# Patient Record
Sex: Male | Born: 1985 | Race: Black or African American | Hispanic: No | Marital: Single | Smoking: Current some day smoker
Health system: Southern US, Community
[De-identification: ages and names within clinical notes are randomized; demographics above are authoritative.]

---
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
## 2011-09-15 ENCOUNTER — Ambulatory Visit: Payer: Self-pay

## 2012-09-24 ENCOUNTER — Ambulatory Visit: Payer: Self-pay | Admitting: Internal Medicine

## 2012-09-24 VITALS — BP 158/76 | HR 71 | Temp 98.0°F | Resp 16 | Ht 69.5 in | Wt 148.0 lb

## 2012-09-24 DIAGNOSIS — M25519 Pain in unspecified shoulder: Secondary | ICD-10-CM

## 2012-09-24 DIAGNOSIS — M549 Dorsalgia, unspecified: Secondary | ICD-10-CM

## 2012-09-24 MED ORDER — MELOXICAM 15 MG PO TABS: 15.0000 mg | ORAL_TABLET | Freq: Every day | ORAL | Status: DC

## 2012-09-25 NOTE — Progress Notes (Signed)
  Subjective:    Patient ID: Derek Golden, male    DOB: 1986-07-08, 27 y.o.   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 Having trouble with pain in his right shoulder in his lumbar region ever since Has to work with heavy objects and has a time-limit for completing tasks There is no associated numbness or weakness Pain does not radiate into the extremities Generally he does not hurt as much at work but  later Does not interfere with sleep  Review of Systems     Objective:   Physical Exam Blood pressure 158/76 Right shoulder has a full range of motion without pain There is no pain with resisted movements He is somewhat tender over the anterior deltoid but not the biceps tendon The lumbar area is nontender to palpation/there is no muscle spasm Straight leg raise intact to 90 bilaterally Deep tendon reflexes intact Forward flexion intact       Assessment & Plan:  Problem #1 back pain Problem #2 shoulder pain  Both of these should represent overuse injuries Mobic 15 daily Exercises for shoulder and low back Modify lifting for the next 2 weeks Followup in 2-4 weeks if not well

## 2012-09-26 ENCOUNTER — Telehealth: Payer: Self-pay

## 2012-09-26 NOTE — Telephone Encounter (Signed)
Patient was advised at [name], he could not work modified duty and he would need to take 2 weeks off. He advised he can not do this. He requested note to return on 09/28/12 full duty. I have provided him the note, and advised him if he has problems he is to let me know. FYI Amy

## 2012-09-26 NOTE — Telephone Encounter (Signed)
PT STATES HE WAS GIVEN AN OOW NOTE FOR 2 WEEKS BUT HE CAN'T BE OUT THAT LONG. WOULD LIKE A NOTE TO GO BACK TO WORK 09/28/12 PLEASE CALL [phone] WHEN READY

## 2012-11-04 ENCOUNTER — Ambulatory Visit: Payer: Self-pay | Admitting: Internal Medicine

## 2012-11-04 VITALS — BP 143/72 | HR 77 | Temp 97.8°F | Resp 16 | Ht 69.18 in | Wt 142.4 lb

## 2012-11-04 DIAGNOSIS — R5383 Other fatigue: Secondary | ICD-10-CM

## 2012-11-04 NOTE — Progress Notes (Signed)
  Subjective:    Patient ID: Derek Golden, male    DOB: 1985-10-09, 27 y.o.   MRN: [id number]  HPI Has recovered from a bug.   Review of Systems     Objective:   Physical Exam Normal       Assessment & Plan:  Note for work

## 2012-11-27 ENCOUNTER — Ambulatory Visit (INDEPENDENT_AMBULATORY_CARE_PROVIDER_SITE_OTHER): Payer: Managed Care, Other (non HMO) | Admitting: Emergency Medicine

## 2012-11-27 VITALS — BP 130/80 | HR 86 | Temp 98.5°F | Resp 16 | Ht 69.0 in | Wt 139.0 lb

## 2012-11-27 DIAGNOSIS — G5682 Other specified mononeuropathies of left upper limb: Secondary | ICD-10-CM

## 2012-11-27 DIAGNOSIS — G5680 Other specified mononeuropathies of unspecified upper limb: Secondary | ICD-10-CM

## 2012-11-27 MED ORDER — NAPROXEN SODIUM 550 MG PO TABS: 550.0000 mg | ORAL_TABLET | Freq: Two times a day (BID) | ORAL | Status: DC

## 2012-11-27 NOTE — Patient Instructions (Addendum)
Back Pain, Adult Low back pain is very common. About 1 in 5 people have back pain.The cause of low back pain is rarely dangerous. The pain often gets better over time.About half of people with a sudden onset of back pain feel better in just 2 weeks. About 8 in 10 people feel better by 6 weeks.  CAUSES Some common causes of back pain include:  Strain of the muscles or ligaments supporting the spine.  Wear and tear (degeneration) of the spinal discs.  Arthritis.  Direct injury to the back. DIAGNOSIS Most of the time, the direct cause of low back pain is not known.However, back pain can be treated effectively even when the exact cause of the pain is unknown.Answering your caregiver's questions about your overall health and symptoms is one of the most accurate ways to make sure the cause of your pain is not dangerous. If your caregiver needs more information, he or she may order lab work or imaging tests (X-rays or MRIs).However, even if imaging tests show changes in your back, this usually does not require surgery. HOME CARE INSTRUCTIONS For many people, back pain returns.Since low back pain is rarely dangerous, it is often a condition that people can learn to manageon their own.   Remain active. It is stressful on the back to sit or stand in one place. Do not sit, drive, or stand in one place for more than 30 minutes at a time. Take short walks on level surfaces as soon as pain allows.Try to increase the length of time you walk each day.  Do not stay in bed.Resting more than 1 or 2 days can delay your recovery.  Do not avoid exercise or work.Your body is made to move.It is not dangerous to be active, even though your back may hurt.Your back will likely heal faster if you return to being active before your pain is gone.  Pay attention to your body when you bend and lift. Many people have less discomfortwhen lifting if they bend their knees, keep the load close to their bodies,and  avoid twisting. Often, the most comfortable positions are those that put less stress on your recovering back.  Find a comfortable position to sleep. Use a firm mattress and lie on your side with your knees slightly bent. If you lie on your back, put a pillow under your knees.  Only take over-the-counter or prescription medicines as directed by your caregiver. Over-the-counter medicines to reduce pain and inflammation are often the most helpful.Your caregiver may prescribe muscle relaxant drugs.These medicines help dull your pain so you can more quickly return to your normal activities and healthy exercise.  Put ice on the injured area.  Put ice in a plastic bag.  Place a towel between your skin and the bag.  Leave the ice on for 15 to 20 minutes, 3 to 4 times a day for the first 2 to 3 days. After that, ice and heat may be alternated to reduce pain and spasms.  Ask your caregiver about trying back exercises and gentle massage. This may be of some benefit.  Avoid feeling anxious or stressed.Stress increases muscle tension and can worsen back pain.It is important to recognize when you are anxious or stressed and learn ways to manage it.Exercise is a great option. SEEK MEDICAL CARE IF:  You have pain that is not relieved with rest or medicine.  You have pain that does not improve in 1 week.  You have new symptoms.  You are generally   not feeling well. SEEK IMMEDIATE MEDICAL CARE IF:   You have pain that radiates from your back into your legs.  You develop new bowel or bladder control problems.  You have unusual weakness or numbness in your arms or legs.  You develop nausea or vomiting.  You develop abdominal pain.  You feel faint. Document Released: 08/23/2005 Document Revised: 02/22/2012 Document Reviewed: 01/11/2011 [employer] Patient Information 2013 [employer].  

## 2012-11-27 NOTE — Progress Notes (Signed)
[facility] [address] [place] [address] [phone]  Date:  11/27/2012   Name:  Derek Golden   DOB:  1986-08-02   MRN:  [id number]  PCP:  No primary provider on file.    Chief Complaint: Back Pain   History of Present Illness:  Derek Golden is a 27 y.o. very pleasant male patient who presents with the following:  Injured his back helping a friend move and was unable to go to work last night on night shift.  Pain not radiating and now largely resolved.  Needs a work note to return to work.  Denies other complaint or health concern today. Works in a [employer].  There is no problem list on file for this patient.   History reviewed. No pertinent past medical history.  History reviewed. No pertinent past surgical history.  History  Substance Use Topics  . Smoking status: Current Some Day Smoker  . Smokeless tobacco: Not on file  . Alcohol Use: 0.6 oz/week    1 Shots of liquor per week     Comment: 1 bottle weekly     History reviewed. No pertinent family history.  No Known Allergies  Medication list has been reviewed and updated.  Current Outpatient Prescriptions on File Prior to Visit  Medication Sig Dispense Refill  . meloxicam (MOBIC) 15 MG tablet Take 1 tablet (15 mg total) by mouth daily.  30 tablet  0   No current facility-administered medications on file prior to visit.    Review of Systems:  As per HPI, otherwise negative.    Physical Examination: Filed Vitals:   11/27/12 1042  BP: 130/80  Pulse: 86  Temp: 98.5 F (36.9 C)  Resp: 16   Filed Vitals:   11/27/12 1042  Height: 5\' 9"  (1.753 m)  Weight: 139 lb (63.05 kg)   Body mass index is 20.52 kg/(m^2). Ideal Body Weight: Weight in (lb) to have BMI = 25: 168.9   GEN: WDWN, NAD, Non-toxic, Alert & Oriented x 3 HEENT: Atraumatic, Normocephalic.  Ears and Nose: No external deformity. EXTR: No clubbing/cyanosis/edema NEURO: Normal gait.  PSYCH: Normally  interactive. Conversant. Not depressed or anxious appearing.  Calm demeanor.  BACK:  Tender inferior to left clavicle angle.  No crepitus.  Assessment and Plan: Scapulocostal syndrome Anaprox Local heat   Signed,  Phillips Odor, MD

## 2013-01-12 ENCOUNTER — Ambulatory Visit: Payer: Managed Care, Other (non HMO)

## 2013-08-19 ENCOUNTER — Ambulatory Visit (INDEPENDENT_AMBULATORY_CARE_PROVIDER_SITE_OTHER): Payer: Managed Care, Other (non HMO) | Admitting: Family Medicine

## 2013-08-19 VITALS — BP 101/64 | HR 89 | Temp 100.6°F | Resp 18 | Ht 69.0 in | Wt 144.0 lb

## 2013-08-19 DIAGNOSIS — R42 Dizziness and giddiness: Secondary | ICD-10-CM

## 2013-08-19 DIAGNOSIS — J111 Influenza due to unidentified influenza virus with other respiratory manifestations: Secondary | ICD-10-CM

## 2013-08-19 DIAGNOSIS — R05 Cough: Secondary | ICD-10-CM

## 2013-08-19 DIAGNOSIS — R059 Cough, unspecified: Secondary | ICD-10-CM

## 2013-08-19 DIAGNOSIS — R509 Fever, unspecified: Secondary | ICD-10-CM

## 2013-08-19 LAB — POCT INFLUENZA A/B: Influenza A, POC: POSITIVE

## 2013-08-19 MED ORDER — OSELTAMIVIR PHOSPHATE 75 MG PO CAPS: 75.0000 mg | ORAL_CAPSULE | Freq: Two times a day (BID) | ORAL | Status: DC

## 2013-08-19 NOTE — Patient Instructions (Addendum)
Start Tamiflu today if you choose to purchase this. Out of work for 7 days total due to contagiousness. Other treatments as below. Increase your fluids, and if dizziness not improving - can recheck in next few days. Return to the clinic or go to the nearest emergency room if any of your symptoms worsen or new symptoms occur. Influenza, Adult Influenza ("the flu") is a viral infection of the respiratory tract. It occurs more often in winter months because people spend more time in close contact with one another. Influenza can make you feel very sick. Influenza easily spreads from person to person (contagious). CAUSES  Influenza is caused by a virus that infects the respiratory tract. You can catch the virus by breathing in droplets from an infected person's cough or sneeze. You can also catch the virus by touching something that was recently contaminated with the virus and then touching your mouth, nose, or eyes. SYMPTOMS  Symptoms typically last 4 to 10 days and may include:  Fever.  Chills.  Headache, body aches, and muscle aches.  Sore throat.  Chest discomfort and cough.  Poor appetite.  Weakness or feeling tired.  Dizziness.  Nausea or vomiting. DIAGNOSIS  Diagnosis of influenza is often made based on your history and a physical exam. A nose or throat swab test can be done to confirm the diagnosis. RISKS AND COMPLICATIONS You may be at risk for a more severe case of influenza if you smoke cigarettes, have diabetes, have chronic heart disease (such as heart failure) or lung disease (such as asthma), or if you have a weakened immune system. Elderly people and pregnant women are also at risk for more serious infections. The most common complication of influenza is a lung infection (pneumonia). Sometimes, this complication can require emergency medical care and may be life-threatening. PREVENTION  An annual influenza vaccination (flu shot) is the best way to avoid getting influenza. An  annual flu shot is now routinely recommended for all adults in the [place] TREATMENT  In mild cases, influenza goes away on its own. Treatment is directed at relieving symptoms. For more severe cases, your caregiver may prescribe antiviral medicines to shorten the sickness. Antibiotic medicines are not effective, because the infection is caused by a virus, not by bacteria. HOME CARE INSTRUCTIONS  Only take over-the-counter or prescription medicines for pain, discomfort, or fever as directed by your caregiver.  Use a cool mist humidifier to make breathing easier.  Get plenty of rest until your temperature returns to normal. This usually takes 3 to 4 days.  Drink enough fluids to keep your urine clear or pale yellow.  Cover your mouth and nose when coughing or sneezing, and wash your hands well to avoid spreading the virus.  Stay home from work or school until your fever has been gone for at least 1 full day. SEEK MEDICAL CARE IF:   You have chest pain or a deep cough that worsens or produces more mucus.  You have nausea, vomiting, or diarrhea. SEEK IMMEDIATE MEDICAL CARE IF:   You have difficulty breathing, shortness of breath, or your skin or nails turn bluish.  You have severe neck pain or stiffness.  You have a severe headache, facial pain, or earache.  You have a worsening or recurring fever.  You have nausea or vomiting that cannot be controlled. MAKE SURE YOU:  Understand these instructions.  Will watch your condition.  Will get help right away if you are not doing well or get worse. Document  Released: 08/20/2000 Document Revised: 02/22/2012 Document Reviewed: 11/22/2011 [facility] Patient Information 2014 [place], [place].

## 2013-08-19 NOTE — Progress Notes (Signed)
Subjective:    Patient ID: Derek Golden, male    DOB: 25-Jun-1986, 25 y.o.   MRN: [id number]  HPI Derek Golden is a 27 y.o. male  Started feeling bad yesterday - cough. Fever yesterday - subjective. Feels lightheaded some today.not dizzy.  No LOC. No focal weakness, generalized weak/rundown.   No attempted treatments.   No flu vaccine this year.   +sick contacts at work - similar sx's - cough, fever.  Case select for Derek Golden. Tobacco: vapor only at work only.    There are no active problems to display for this patient.  History reviewed. No pertinent past medical history. History reviewed. No pertinent past surgical history. No Known Allergies Prior to Admission medications   Not on File   History   Social History  . Marital Status: Single    Spouse Name: N/A    Number of Children: N/A  . Years of Education: N/A   Occupational History  . Not on file.   Social History Main Topics  . Smoking status: Current Some Day Smoker  . Smokeless tobacco: Not on file  . Alcohol Use: 0.6 oz/week    1 Shots of liquor per week     Comment: 1 bottle weekly   . Drug Use: No  . Sexual Activity: Yes   Other Topics Concern  . Not on file   Social History Narrative  . No narrative on file       Review of Systems  Constitutional: Positive for fever and chills.  HENT: Negative for congestion, rhinorrhea, sore throat and trouble swallowing.   Respiratory: Positive for cough (usually dry. ).   Cardiovascular: Negative for chest pain and palpitations.  Gastrointestinal: Negative for nausea, vomiting and abdominal pain.  Genitourinary: Negative for urgency and difficulty urinating.  Musculoskeletal: Positive for myalgias.  Skin: Negative for rash.  Neurological: Positive for light-headedness. Negative for dizziness, tremors, syncope, facial asymmetry, speech difficulty, weakness (gen'd only, not focal. ) and headaches.       Objective:   Physical Exam  Vitals  reviewed. Constitutional: He is oriented to person, place, and time. He appears well-developed and well-nourished.  HENT:  Head: Normocephalic and atraumatic.  Right Ear: Tympanic membrane, external ear and ear canal normal.  Left Ear: Tympanic membrane, external ear and ear canal normal.  Nose: No rhinorrhea.  Mouth/Throat: Oropharynx is clear and moist and mucous membranes are normal. No oropharyngeal exudate or posterior oropharyngeal erythema.  Eyes: Conjunctivae are normal. Pupils are equal, round, and reactive to light.  Neck: Neck supple.  Cardiovascular: Normal rate, regular rhythm, normal heart sounds, intact distal pulses and normal pulses.   No extrasystoles are present. Exam reveals no friction rub.   No murmur heard. Pulmonary/Chest: Effort normal and breath sounds normal. He has no wheezes. He has no rhonchi. He has no rales.  Abdominal: Soft. There is no tenderness.  Lymphadenopathy:    He has no cervical adenopathy.  Neurological: He is alert and oriented to person, place, and time. He has normal strength. He displays no tremor. No cranial nerve deficit or sensory deficit. Coordination and gait normal. GCS eye subscore is 4. GCS verbal subscore is 5. GCS motor subscore is 6.  Nonfocal, no pronator drift. Negative heel to toe.   Skin: Skin is warm and dry. No rash noted.  Psychiatric: He has a normal mood and affect. His behavior is normal.   Filed Vitals:   08/19/13 1400  BP: 102/64  Pulse: 99  Temp:  100.6 F (38.1 C)  Resp: 18  Height: 5\' 9"  (1.753 m)  Weight: 144 lb (65.318 kg)  SpO2: 100%    Results for orders placed in visit on 08/19/13  POCT INFLUENZA A/B      Result Value Range   Influenza A, POC Positive     Influenza B, POC Negative     Filed Vitals:   08/19/13 1400 08/19/13 1432 08/19/13 1433 08/19/13 1434  BP: 102/64 101/64 117/72 101/64  Pulse: 99 89 98 89  Temp: 100.6 F (38.1 C)     Resp: 18     Height: 5\' 9"  (1.753 m)     Weight: 144 lb  (65.318 kg)     SpO2: 100%           Assessment & Plan:   Derek Golden is a 27 y.o. male Influenza, Fever, Cough -  tamiflu rx given if decides to fill. Contact precautions - note for work, sx care and rtc precautions as below.   Dizziness - Plan: POCT Influenza A/B - volume depletion vs. secondary to flu. Not orthostatic, Nonfocal neuro exam. instructed to push fluids, rtc precautions.  Meds ordered this encounter  Medications  . oseltamivir (TAMIFLU) 75 MG capsule    Sig: Take 1 capsule (75 mg total) by mouth 2 (two) times daily.    Dispense:  10 capsule    Refill:  0   Patient Instructions  Start Tamiflu today if you choose to purchase this. Out of work for 7 days total due to contagiousness. Other treatments as below. Increase your fluids, and if dizziness not improving - can recheck in next few days. Return to the clinic or go to the nearest emergency room if any of your symptoms worsen or new symptoms occur. Influenza, Adult Influenza ("the flu") is a viral infection of the respiratory tract. It occurs more often in winter months because people spend more time in close contact with one another. Influenza can make you feel very sick. Influenza easily spreads from person to person (contagious). CAUSES  Influenza is caused by a virus that infects the respiratory tract. You can catch the virus by breathing in droplets from an infected person's cough or sneeze. You can also catch the virus by touching something that was recently contaminated with the virus and then touching your mouth, nose, or eyes. SYMPTOMS  Symptoms typically last 4 to 10 days and may include:  Fever.  Chills.  Headache, body aches, and muscle aches.  Sore throat.  Chest discomfort and cough.  Poor appetite.  Weakness or feeling tired.  Dizziness.  Nausea or vomiting. DIAGNOSIS  Diagnosis of influenza is often made based on your history and a physical exam. A nose or throat swab test can be done to  confirm the diagnosis. RISKS AND COMPLICATIONS You may be at risk for a more severe case of influenza if you smoke cigarettes, have diabetes, have chronic heart disease (such as heart failure) or lung disease (such as asthma), or if you have a weakened immune system. Elderly people and pregnant women are also at risk for more serious infections. The most common complication of influenza is a lung infection (pneumonia). Sometimes, this complication can require emergency medical care and may be life-threatening. PREVENTION  An annual influenza vaccination (flu shot) is the best way to avoid getting influenza. An annual flu shot is now routinely recommended for all adults in the [place] TREATMENT  In mild cases, influenza goes away on its own. Treatment is  directed at relieving symptoms. For more severe cases, your caregiver may prescribe antiviral medicines to shorten the sickness. Antibiotic medicines are not effective, because the infection is caused by a virus, not by bacteria. HOME CARE INSTRUCTIONS  Only take over-the-counter or prescription medicines for pain, discomfort, or fever as directed by your caregiver.  Use a cool mist humidifier to make breathing easier.  Get plenty of rest until your temperature returns to normal. This usually takes 3 to 4 days.  Drink enough fluids to keep your urine clear or pale yellow.  Cover your mouth and nose when coughing or sneezing, and wash your hands well to avoid spreading the virus.  Stay home from work or school until your fever has been gone for at least 1 full day. SEEK MEDICAL CARE IF:   You have chest pain or a deep cough that worsens or produces more mucus.  You have nausea, vomiting, or diarrhea. SEEK IMMEDIATE MEDICAL CARE IF:   You have difficulty breathing, shortness of breath, or your skin or nails turn bluish.  You have severe neck pain or stiffness.  You have a severe headache, facial pain, or earache.  You have a worsening or  recurring fever.  You have nausea or vomiting that cannot be controlled. MAKE SURE YOU:  Understand these instructions.  Will watch your condition.  Will get help right away if you are not doing well or get worse. Document Released: 08/20/2000 Document Revised: 02/22/2012 Document Reviewed: 11/22/2011 [facility] Patient Information 2014 [place], [place].

## 2014-05-27 ENCOUNTER — Ambulatory Visit (INDEPENDENT_AMBULATORY_CARE_PROVIDER_SITE_OTHER): Payer: Managed Care, Other (non HMO) | Admitting: Family Medicine

## 2014-05-27 VITALS — BP 124/70 | HR 74 | Temp 98.1°F | Resp 16 | Ht 70.0 in | Wt 141.0 lb

## 2014-05-27 DIAGNOSIS — M545 Low back pain, unspecified: Secondary | ICD-10-CM

## 2014-05-27 MED ORDER — CYCLOBENZAPRINE HCL 5 MG PO TABS: 5.0000 mg | ORAL_TABLET | Freq: Three times a day (TID) | ORAL | Status: DC | PRN

## 2014-05-27 MED ORDER — MELOXICAM 15 MG PO TABS: 15.0000 mg | ORAL_TABLET | Freq: Every day | ORAL | Status: DC | PRN

## 2014-05-27 NOTE — Progress Notes (Signed)
Derek Golden is a 28 y.o. male who presents to Urgent Care today with complaints of back pain:  1.  Back pain:  Lower back pain present x 1 week.  Worse when bending over or twisting.  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  No trauama, but did play basketball yesterday and feels more sore today.  Worse on Left. Pain is 7 / 10 at worst, not that painful currently.  Does not radiate to buttocks or legs.  No LE weakness or changes in gait.  No motor weakness/decreased sensation BL LE's.  No fevers or chills.  No dysuria, hematuria, urinary frequency, incontinence of bladder or bowel.     PMH reviewed.  History reviewed. No pertinent past medical history. History reviewed. No pertinent past surgical history.  Medications reviewed. No current outpatient prescriptions on file.   No current facility-administered medications for this visit.    ROS as above otherwise neg.  No chest pain, palpitations, SOB, Fever, Chills, Abd pain, N/V/D.   Physical Exam:  BP 124/70  Pulse 74  Temp(Src) 98.1 F (36.7 C)  Resp 16  Ht  (1.778 m)  Wt 141 lb (63.957 kg)  BMI 20.23 kg/m2  SpO2 100% Gen:  Alert, cooperative patient who appears stated age in no acute distress.  Vital signs reviewed. Psych:  Pleasant, conversant Back:  Normal skin, Spine with normal alignment and no deformity.  No tenderness to vertebral process palpation.  Paraspinous muscles are tender and with spasm Left lumbar region.   Range of motion is full at neck and lumbar sacral regions.  Straight leg raise is negative BL. Neuro:  Sensation and motor function 5/5 bilateral lower extremities.  Patellar and Achilles  DTR's +2 patellar BL.  He is able to walk on his heels and toes without difficulty.    Assessment and Plan:  1.  Lumbago:   - no red flags. - Left lumbar strain - Flexeril and Mobic for relief - Heat and massage - See instructions  - out of work tomorrow for rest, return Wednesday

## 2014-05-27 NOTE — Patient Instructions (Signed)
Take the Flexeril as a muscle relaxer.  This may make you sleepy.  Do not drive with this.    Take the Mobic for pain relief once a day.    We will get you a note for work.    Going forward, use these exercises to strengthen your back.  Low Back Sprain with Rehab  A sprain is an injury in which a ligament is torn. The ligaments of the lower back are vulnerable to sprains. However, they are strong and require great force to be injured. These ligaments are important for stabilizing the spinal column. Sprains are classified into three categories. Grade 1 sprains cause pain, but the tendon is not lengthened. Grade 2 sprains include a lengthened ligament, due to the ligament being stretched or partially ruptured. With grade 2 sprains there is still function, although the function may be decreased. Grade 3 sprains involve a complete tear of the tendon or muscle, and function is usually impaired. SYMPTOMS   Severe pain in the lower back.  Sometimes, a feeling of a "pop," "snap," or tear, at the time of injury.  Tenderness and sometimes swelling at the injury site.  Uncommonly, bruising (contusion) within 48 hours of injury.  Muscle spasms in the back. CAUSES  Low back sprains occur when a force is placed on the ligaments that is greater than they can handle. Common causes of injury include:  Performing a stressful act while off-balance.  Repetitive stressful activities that involve movement of the lower back.  Direct hit (trauma) to the lower back. RISK INCREASES WITH:  Contact sports (football, wrestling).  Collisions (major skiing accidents).  Sports that require throwing or lifting (baseball, weightlifting).  Sports involving twisting of the spine (gymnastics, diving, tennis, golf).  Poor strength and flexibility.  Inadequate protection.  Previous back injury or surgery (especially fusion). PREVENTION  Wear properly fitted and padded protective equipment.  Warm up and  stretch properly before activity.  Allow for adequate recovery between workouts.  Maintain physical fitness:  Strength, flexibility, and endurance.  Cardiovascular fitness.  Maintain a healthy body weight. PROGNOSIS  If treated properly, low back sprains usually heal with non-surgical treatment. The length of time for healing depends on the severity of the injury.  RELATED COMPLICATIONS   Recurring symptoms, resulting in a chronic problem.  Chronic inflammation and pain in the low back.  Delayed healing or resolution of symptoms, especially if activity is resumed too soon.  Prolonged impairment.  Unstable or arthritic joints of the low back. TREATMENT  Treatment first involves the use of ice and medicine, to reduce pain and inflammation. The use of strengthening and stretching exercises may help reduce pain with activity. These exercises may be performed at home or with a therapist. Severe injuries may require referral to a therapist for further evaluation and treatment, such as ultrasound. Your caregiver may advise that you wear a back brace or corset, to help reduce pain and discomfort. Often, prolonged bed rest results in greater harm then benefit. Corticosteroid injections may be recommended. However, these should be reserved for the most serious cases. It is important to avoid using your back when lifting objects. At night, sleep on your back on a firm mattress, with a pillow placed under your knees. If non-surgical treatment is unsuccessful, surgery may be needed.  MEDICATION   If pain medicine is needed, nonsteroidal anti-inflammatory medicines (aspirin and ibuprofen), or other minor pain relievers (acetaminophen), are often advised.  Do not take pain medicine for 7 days  before surgery.  Prescription pain relievers may be given, if your caregiver thinks they are needed. Use only as directed and only as much as you need.  Ointments applied to the skin may be  helpful.  Corticosteroid injections may be given by your caregiver. These injections should be reserved for the most serious cases, because they may only be given a certain number of times. HEAT AND COLD  Cold treatment (icing) should be applied for 10 to 15 minutes every 2 to 3 hours for inflammation and pain, and immediately after activity that aggravates your symptoms. Use ice packs or an ice massage.  Heat treatment may be used before performing stretching and strengthening activities prescribed by your caregiver, physical therapist, or athletic trainer. Use a heat pack or a warm water soak. SEEK MEDICAL CARE IF:   Symptoms get worse or do not improve in 2 to 4 weeks, despite treatment.  You develop numbness or weakness in either leg.  You lose bowel or bladder function.  Any of the following occur after surgery: fever, increased pain, swelling, redness, drainage of fluids, or bleeding in the affected area.  New, unexplained symptoms develop. (Drugs used in treatment may produce side effects.) EXERCISES  RANGE OF MOTION (ROM) AND STRETCHING EXERCISES - Low Back Sprain Most people with lower back pain will find that their symptoms get worse with excessive bending forward (flexion) or arching at the lower back (extension). The exercises that will help resolve your symptoms will focus on the opposite motion.  Your physician, physical therapist or athletic trainer will help you determine which exercises will be most helpful to resolve your lower back pain. Do not complete any exercises without first consulting with your caregiver. Discontinue any exercises which make your symptoms worse, until you speak to your caregiver. If you have pain, numbness or tingling which travels down into your buttocks, leg or foot, the goal of the therapy is for these symptoms to move closer to your back and eventually resolve. Sometimes, these leg symptoms will get better, but your lower back pain may worsen.  This is often an indication of progress in your rehabilitation. Be very alert to any changes in your symptoms and the activities in which you participated in the 24 hours prior to the change. Sharing this information with your caregiver will allow him or her to most efficiently treat your condition. These exercises may help you when beginning to rehabilitate your injury. Your symptoms may resolve with or without further involvement from your physician, physical therapist or athletic trainer. While completing these exercises, remember:   Restoring tissue flexibility helps normal motion to return to the joints. This allows healthier, less painful movement and activity.  An effective stretch should be held for at least 30 seconds.  A stretch should never be painful. You should only feel a gentle lengthening or release in the stretched tissue. FLEXION RANGE OF MOTION AND STRETCHING EXERCISES: STRETCH - Flexion, Single Knee to Chest   Lie on a firm bed or floor with both legs extended in front of you.  Keeping one leg in contact with the floor, bring your opposite knee to your chest. Hold your leg in place by either grabbing behind your thigh or at your knee.  Pull until you feel a gentle stretch in your low back. Hold __________ seconds.  Slowly release your grasp and repeat the exercise with the opposite side. Repeat __________ times. Complete this exercise __________ times per day.  STRETCH - Flexion, Double Knee  to Chest  Lie on a firm bed or floor with both legs extended in front of you.  Keeping one leg in contact with the floor, bring your opposite knee to your chest.  Tense your stomach muscles to support your back and then lift your other knee to your chest. Hold your legs in place by either grabbing behind your thighs or at your knees.  Pull both knees toward your chest until you feel a gentle stretch in your low back. Hold __________ seconds.  Tense your stomach muscles and slowly  return one leg at a time to the floor. Repeat __________ times. Complete this exercise __________ times per day.  STRETCH - Low Trunk Rotation  Lie on a firm bed or floor. Keeping your legs in front of you, bend your knees so they are both pointed toward the ceiling and your feet are flat on the floor.  Extend your arms out to the side. This will stabilize your upper body by keeping your shoulders in contact with the floor.  Gently and slowly drop both knees together to one side until you feel a gentle stretch in your low back. Hold for __________ seconds.  Tense your stomach muscles to support your lower back as you bring your knees back to the starting position. Repeat the exercise to the other side. Repeat __________ times. Complete this exercise __________ times per day  EXTENSION RANGE OF MOTION AND FLEXIBILITY EXERCISES: STRETCH - Extension, Prone on Elbows   Lie on your stomach on the floor, a bed will be too soft. Place your palms about shoulder width apart and at the height of your head.  Place your elbows under your shoulders. If this is too painful, stack pillows under your chest.  Allow your body to relax so that your hips drop lower and make contact more completely with the floor.  Hold this position for __________ seconds.  Slowly return to lying flat on the floor. Repeat __________ times. Complete this exercise __________ times per day.  RANGE OF MOTION - Extension, Prone Press Ups  Lie on your stomach on the floor, a bed will be too soft. Place your palms about shoulder width apart and at the height of your head.  Keeping your back as relaxed as possible, slowly straighten your elbows while keeping your hips on the floor. You may adjust the placement of your hands to maximize your comfort. As you gain motion, your hands will come more underneath your shoulders.  Hold this position __________ seconds.  Slowly return to lying flat on the floor. Repeat __________ times.  Complete this exercise __________ times per day.  RANGE OF MOTION- Quadruped, Neutral Spine   Assume a hands and knees position on a firm surface. Keep your hands under your shoulders and your knees under your hips. You may place padding under your knees for comfort.  Drop your head and point your tailbone toward the ground below you. This will round out your lower back like an angry cat. Hold this position for __________ seconds.  Slowly lift your head and release your tail bone so that your back sags into a large arch, like an old horse.  Hold this position for __________ seconds.  Repeat this until you feel limber in your low back.  Now, find your "sweet spot." This will be the most comfortable position somewhere between the two previous positions. This is your neutral spine. Once you have found this position, tense your stomach muscles to support your low  back.  Hold this position for __________ seconds. Repeat __________ times. Complete this exercise __________ times per day.  STRENGTHENING EXERCISES - Low Back Sprain These exercises may help you when beginning to rehabilitate your injury. These exercises should be done near your "sweet spot." This is the neutral, low-back arch, somewhere between fully rounded and fully arched, that is your least painful position. When performed in this safe range of motion, these exercises can be used for people who have either a flexion or extension based injury. These exercises may resolve your symptoms with or without further involvement from your physician, physical therapist or athletic trainer. While completing these exercises, remember:   Muscles can gain both the endurance and the strength needed for everyday activities through controlled exercises.  Complete these exercises as instructed by your physician, physical therapist or athletic trainer. Increase the resistance and repetitions only as guided.  You may experience muscle soreness or  fatigue, but the pain or discomfort you are trying to eliminate should never worsen during these exercises. If this pain does worsen, stop and make certain you are following the directions exactly. If the pain is still present after adjustments, discontinue the exercise until you can discuss the trouble with your caregiver. STRENGTHENING - Deep Abdominals, Pelvic Tilt   Lie on a firm bed or floor. Keeping your legs in front of you, bend your knees so they are both pointed toward the ceiling and your feet are flat on the floor.  Tense your lower abdominal muscles to press your low back into the floor. This motion will rotate your pelvis so that your tail bone is scooping upwards rather than pointing at your feet or into the floor. With a gentle tension and even breathing, hold this position for __________ seconds. Repeat __________ times. Complete this exercise __________ times per day.  STRENGTHENING - Abdominals, Crunches   Lie on a firm bed or floor. Keeping your legs in front of you, bend your knees so they are both pointed toward the ceiling and your feet are flat on the floor. Cross your arms over your chest.  Slightly tip your chin down without bending your neck.  Tense your abdominals and slowly lift your trunk high enough to just clear your shoulder blades. Lifting higher can put excessive stress on the lower back and does not further strengthen your abdominal muscles.  Control your return to the starting position. Repeat __________ times. Complete this exercise __________ times per day.  STRENGTHENING - Quadruped, Opposite UE/LE Lift   Assume a hands and knees position on a firm surface. Keep your hands under your shoulders and your knees under your hips. You may place padding under your knees for comfort.  Find your neutral spine and gently tense your abdominal muscles so that you can maintain this position. Your shoulders and hips should form a rectangle that is parallel with the  floor and is not twisted.  Keeping your trunk steady, lift your right hand no higher than your shoulder and then your left leg no higher than your hip. Make sure you are not holding your breath. Hold this position for __________ seconds.  Continuing to keep your abdominal muscles tense and your back steady, slowly return to your starting position. Repeat with the opposite arm and leg. Repeat __________ times. Complete this exercise __________ times per day.  STRENGTHENING - Abdominals and Quadriceps, Straight Leg Raise   Lie on a firm bed or floor with both legs extended in front of you.  Keeping one leg in contact with the floor, bend the other knee so that your foot can rest flat on the floor.  Find your neutral spine, and tense your abdominal muscles to maintain your spinal position throughout the exercise.  Slowly lift your straight leg off the floor about 6 inches for a count of 15, making sure to not hold your breath.  Still keeping your neutral spine, slowly lower your leg all the way to the floor. Repeat this exercise with each leg __________ times. Complete this exercise __________ times per day. POSTURE AND BODY MECHANICS CONSIDERATIONS - Low Back Sprain Keeping correct posture when sitting, standing or completing your activities will reduce the stress put on different body tissues, allowing injured tissues a chance to heal and limiting painful experiences. The following are general guidelines for improved posture. Your physician or physical therapist will provide you with any instructions specific to your needs. While reading these guidelines, remember:  The exercises prescribed by your provider will help you have the flexibility and strength to maintain correct postures.  The correct posture provides the best environment for your joints to work. All of your joints have less wear and tear when properly supported by a spine with good posture. This means you will experience a  healthier, less painful body.  Correct posture must be practiced with all of your activities, especially prolonged sitting and standing. Correct posture is as important when doing repetitive low-stress activities (typing) as it is when doing a single heavy-load activity (lifting). RESTING POSITIONS Consider which positions are most painful for you when choosing a resting position. If you have pain with flexion-based activities (sitting, bending, stooping, squatting), choose a position that allows you to rest in a less flexed posture. You would want to avoid curling into a fetal position on your side. If your pain worsens with extension-based activities (prolonged standing, working overhead), avoid resting in an extended position such as sleeping on your stomach. Most people will find more comfort when they rest with their spine in a more neutral position, neither too rounded nor too arched. Lying on a non-sagging bed on your side with a pillow between your knees, or on your back with a pillow under your knees will often provide some relief. Keep in mind, being in any one position for a prolonged period of time, no matter how correct your posture, can still lead to stiffness. PROPER SITTING POSTURE In order to minimize stress and discomfort on your spine, you must sit with correct posture. Sitting with good posture should be effortless for a healthy body. Returning to good posture is a gradual process. Many people can work toward this most comfortably by using various supports until they have the flexibility and strength to maintain this posture on their own. When sitting with proper posture, your ears will fall over your shoulders and your shoulders will fall over your hips. You should use the back of the chair to support your upper back. Your lower back will be in a neutral position, just slightly arched. You may place a small pillow or folded towel at the base of your lower back for  support.  When  working at a desk, create an environment that supports good, upright posture. Without extra support, muscles tire, which leads to excessive strain on joints and other tissues. Keep these recommendations in mind: CHAIR:  A chair should be able to slide under your desk when your back makes contact with the back of the chair. This allows  you to work closely.  The chair's height should allow your eyes to be level with the upper part of your monitor and your hands to be slightly lower than your elbows. BODY POSITION  Your feet should make contact with the floor. If this is not possible, use a foot rest.  Keep your ears over your shoulders. This will reduce stress on your neck and low back. INCORRECT SITTING POSTURES  If you are feeling tired and unable to assume a healthy sitting posture, do not slouch or slump. This puts excessive strain on your back tissues, causing more damage and pain. Healthier options include:  Using more support, like a lumbar pillow.  Switching tasks to something that requires you to be upright or walking.  Talking a brief walk.  Lying down to rest in a neutral-spine position. PROLONGED STANDING WHILE SLIGHTLY LEANING FORWARD  When completing a task that requires you to lean forward while standing in one place for a long time, place either foot up on a stationary 2-4 inch high object to help maintain the best posture. When both feet are on the ground, the lower back tends to lose its slight inward curve. If this curve flattens (or becomes too large), then the back and your other joints will experience too much stress, tire more quickly, and can cause pain. CORRECT STANDING POSTURES Proper standing posture should be assumed with all daily activities, even if they only take a few moments, like when brushing your teeth. As in sitting, your ears should fall over your shoulders and your shoulders should fall over your hips. You should keep a slight tension in your abdominal  muscles to brace your spine. Your tailbone should point down to the ground, not behind your body, resulting in an over-extended swayback posture.  INCORRECT STANDING POSTURES  Common incorrect standing postures include a forward head, locked knees and/or an excessive swayback. WALKING Walk with an upright posture. Your ears, shoulders and hips should all line-up. PROLONGED ACTIVITY IN A FLEXED POSITION When completing a task that requires you to bend forward at your waist or lean over a low surface, try to find a way to stabilize 3 out of 4 of your limbs. You can place a hand or elbow on your thigh or rest a knee on the surface you are reaching across. This will provide you more stability, so that your muscles do not tire as quickly. By keeping your knees relaxed, or slightly bent, you will also reduce stress across your lower back. CORRECT LIFTING TECHNIQUES DO :  Assume a wide stance. This will provide you more stability and the opportunity to get as close as possible to the object which you are lifting.  Tense your abdominals to brace your spine. Bend at the knees and hips. Keeping your back locked in a neutral-spine position, lift using your leg muscles. Lift with your legs, keeping your back straight.  Test the weight of unknown objects before attempting to lift them.  Try to keep your elbows locked down at your sides in order get the best strength from your shoulders when carrying an object.  Always ask for help when lifting heavy or awkward objects. INCORRECT LIFTING TECHNIQUES DO NOT:   Lock your knees when lifting, even if it is a small object.  Bend and twist. Pivot at your feet or move your feet when needing to change directions.  Assume that you can safely pick up even a paperclip without proper posture. Document Released: 08/23/2005 Document Revised:  11/15/2011 Document Reviewed: 12/05/2008 Exit[employer] Patient Information 2015 [place], [place]. This information is not intended to  replace advice given to you by your health care provider. Make sure you discuss any questions you have with your health care provider.

## 2014-06-14 ENCOUNTER — Ambulatory Visit (INDEPENDENT_AMBULATORY_CARE_PROVIDER_SITE_OTHER): Payer: Managed Care, Other (non HMO) | Admitting: Emergency Medicine

## 2014-06-14 VITALS — BP 116/88 | HR 62 | Temp 98.3°F | Resp 16 | Ht 69.0 in | Wt 140.0 lb

## 2014-06-14 DIAGNOSIS — S91332A Puncture wound without foreign body, left foot, initial encounter: Secondary | ICD-10-CM

## 2014-06-14 DIAGNOSIS — Z23 Encounter for immunization: Secondary | ICD-10-CM

## 2014-06-14 MED ORDER — CIPROFLOXACIN HCL 500 MG PO TABS
500.0000 mg | ORAL_TABLET | Freq: Two times a day (BID) | ORAL | Status: AC
Start: 2014-06-14 — End: ?

## 2014-06-14 NOTE — Patient Instructions (Signed)

## 2014-06-14 NOTE — Progress Notes (Signed)
[facility] [address], [place] [id number] [phone]  Date:  06/14/2014   Name:  Derek Golden Rosemeyer   DOB:  1986/08/03   MRN:  [id number]  PCP:  No PCP Per Patient    Chief Complaint: Foot Pain   History of Present Illness:  Derek Golden Peretz is a 28 y.o. very pleasant male patient who presents with the following:  Today helping a friend move, he stepped on a nail that passed through his sneaker into the ball of his foot today. Not current on TD No fever or pain Denies other complaint or health concern today.    There are no active problems to display for this patient.   History reviewed. No pertinent past medical history.  History reviewed. No pertinent past surgical history.  History  Substance Use Topics  . Smoking status: Current Some Day Smoker  . Smokeless tobacco: Not on file  . Alcohol Use: 0.6 oz/week    1 Shots of liquor per week     Comment: 1 bottle weekly     History reviewed. No pertinent family history.  No Known Allergies  Medication list has been reviewed and updated.  No current outpatient prescriptions on file prior to visit.   No current facility-administered medications on file prior to visit.    Review of Systems:  As per HPI, otherwise negative.    Physical Examination: Filed Vitals:   06/14/14 1622  BP: 116/88  Pulse: 62  Temp: 98.3 F (36.8 C)  Resp: 16   Filed Vitals:   06/14/14 1622  Height: 5\' 9"  (1.753 m)  Weight: 140 lb (63.504 kg)   Body mass index is 20.67 kg/(m^2). Ideal Body Weight: Weight in (lb) to have BMI = 25: 168.9   GEN: WDWN, NAD, Non-toxic, Alert & Oriented x 3 HEENT: Atraumatic, Normocephalic.  Ears and Nose: No external deformity. EXTR: No clubbing/cyanosis/edema NEURO: Normal gait.  PSYCH: Normally interactive. Conversant. Not depressed or anxious appearing.  Calm demeanor.  Puncture wound ball of right foot.  No FB.  NATI.   Assessment and Plan: Nail puncture right  foot TDAP cipro  Signed,  Phillips OdorJeffery Bucky Grigg, MD
